# Patient Record
Sex: Male | Born: 1990 | Race: White | Hispanic: No | Marital: Single | Smoking: Never smoker
Health system: Southern US, Community
[De-identification: ages and names within clinical notes are randomized; demographics above are authoritative.]

---

## 2015-05-11 ENCOUNTER — Encounter (HOSPITAL_COMMUNITY): Payer: Self-pay | Admitting: Emergency Medicine

## 2015-05-11 ENCOUNTER — Emergency Department (HOSPITAL_COMMUNITY)

## 2015-05-11 ENCOUNTER — Emergency Department (HOSPITAL_COMMUNITY)
Admission: EM | Admit: 2015-05-11 | Discharge: 2015-05-11 | Disposition: A | Discharge status: Home / Self Care | Attending: Emergency Medicine | Admitting: Emergency Medicine

## 2015-05-11 DIAGNOSIS — Y92310 Basketball court as the place of occurrence of the external cause: Secondary | ICD-10-CM | POA: Insufficient documentation

## 2015-05-11 DIAGNOSIS — X58XXXA Exposure to other specified factors, initial encounter: Secondary | ICD-10-CM | POA: Diagnosis not present

## 2015-05-11 DIAGNOSIS — Y998 Other external cause status: Secondary | ICD-10-CM | POA: Diagnosis not present

## 2015-05-11 DIAGNOSIS — Y9367 Activity, basketball: Secondary | ICD-10-CM | POA: Diagnosis not present

## 2015-05-11 DIAGNOSIS — S92901A Unspecified fracture of right foot, initial encounter for closed fracture: Secondary | ICD-10-CM | POA: Diagnosis not present

## 2015-05-11 DIAGNOSIS — S99921A Unspecified injury of right foot, initial encounter: Secondary | ICD-10-CM | POA: Diagnosis present

## 2015-05-11 MED ORDER — IBUPROFEN 800 MG PO TABS: 800.0000 mg | ORAL_TABLET | Freq: Three times a day (TID) | ORAL | Status: AC

## 2015-05-11 MED ORDER — IBUPROFEN 800 MG PO TABS
800.0000 mg | ORAL_TABLET | Freq: Once | ORAL | Status: AC
  Administered 2015-05-11: 800 mg via ORAL
  Filled 2015-05-11: qty 1

## 2015-05-11 NOTE — ED Notes (Signed)
Pt reports was playing basketball and "rolled" right ankle. No deformity noted.

## 2015-05-11 NOTE — Discharge Instructions (Signed)
Metatarsal Fracture, Undisplaced  A metatarsal fracture is a break in the bone(s) of the foot. These are the bones of the foot that connect your toes to the bones of the ankle.  DIAGNOSIS   The diagnoses of these fractures are usually made with X-rays. If there are problems in the forefoot and x-rays are normal a later bone scan will usually make the diagnosis.   TREATMENT AND HOME CARE INSTRUCTIONS  · Treatment may or may not include a cast or walking shoe. When casts are needed the use is usually for short periods of time so as not to slow down healing with muscle wasting (atrophy).  · Activities should be stopped until further advised by your caregiver.  · Wear shoes with adequate shock absorbing capabilities and stiff soles.  · Alternative exercise may be undertaken while waiting for healing. These may include bicycling and swimming, or as your caregiver suggests.  · It is important to keep all follow-up visits or specialty referrals. The failure to keep these appointments could result in improper bone healing and chronic pain or disability.  · Warning: Do not drive a car or operate a motor vehicle until your caregiver specifically tells you it is safe to do so.  IF YOU DO NOT HAVE A CAST OR SPLINT:  · You may walk on your injured foot as tolerated or advised.  · Do not put any weight on your injured foot for as long as directed by your caregiver. Slowly increase the amount of time you walk on the foot as the pain allows or as advised.  · Use crutches until you can bear weight without pain. A gradual increase in weight bearing may help.  · Apply ice to the injury for 15-20 minutes each hour while awake for the first 2 days. Put the ice in a plastic bag and place a towel between the bag of ice and your skin.  · Only take over-the-counter or prescription medicines for pain, discomfort, or fever as directed by your caregiver.  SEEK IMMEDIATE MEDICAL CARE IF:   · Your cast gets damaged or breaks.  · You have  continued severe pain or more swelling than you did before the cast was put on, or the pain is not controlled with medications.  · Your skin or nails below the injury turn blue or grey, or feel cold or numb.  · There is a bad smell, or new stains or pus-like (purulent) drainage coming from the cast.  MAKE SURE YOU:   · Understand these instructions.  · Will watch your condition.  · Will get help right away if you are not doing well or get worse.  Document Released: 05/31/2002 Document Revised: 12/01/2011 Document Reviewed: 04/21/2008  [employer]® Patient Information ©2015 [employer]. This information is not intended to replace advice given to you by your health care provider. Make sure you discuss any questions you have with your health care provider.

## 2015-05-13 NOTE — ED Provider Notes (Addendum)
CSN: [id number]     Arrival date & time 05/11/15  1252 History   First MD Initiated Contact with Patient 05/11/15 1416     Chief Complaint  Patient presents with  . Foot Injury     (Consider location/radiation/quality/duration/timing/severity/associated sxs/prior Treatment) HPI   Dylan Mcintosh is a 24 y.o. male who is an imate at a local correctional facility, presents to the Emergency Department complaining of right foot and ankle pain.  He states that he was playing basketball shortly before ED arrival and "rolled" his ankle.  He c/o pain with attempted weight bearing.  Improves at rest.  He reports swelling to the outside of his foot.  He has not taken any medication for pain.  He denies open wound, weakness, knee pain, or other injuries.     History reviewed. No pertinent past medical history. History reviewed. No pertinent past surgical history. History reviewed. No pertinent family history. Social History  Substance Use Topics  . Smoking status: Never Smoker   . Smokeless tobacco: None  . Alcohol Use: No    Review of Systems  Constitutional: Negative for fever and chills.  Musculoskeletal: Positive for joint swelling and arthralgias.  Skin: Negative for color change and wound.  All other systems reviewed and are negative.     Allergies  Review of patient's allergies indicates not on file.  Home Medications   Prior to Admission medications   Medication Sig Start Date End Date Taking? Authorizing Provider  ibuprofen (ADVIL,MOTRIN) 800 MG tablet Take 1 tablet (800 mg total) by mouth 3 (three) times daily. 05/11/15   Kepler Mccabe, PA-C   BP 114/67 mmHg  Pulse 61  Temp(Src) 98.3 F (36.8 C) (Oral)  Resp 14  Ht 5\' 11"  (1.803 m)  Wt 175 lb (79.379 kg)  BMI 24.42 kg/m2  SpO2 100% Physical Exam  Constitutional: He is oriented to person, place, and time. He appears well-developed and well-nourished. No distress.  HENT:  Head: Normocephalic and atraumatic.   Cardiovascular: Normal rate, regular rhythm, normal heart sounds and intact distal pulses.   Pulmonary/Chest: Effort normal and breath sounds normal. No respiratory distress.  Musculoskeletal: He exhibits edema and tenderness.  Right lateral foot and ankle is ttp, mild STS is present.  ROM is preserved.  DP pulse is brisk,distal sensation intact.  No erythema, abrasion, bruising or bony deformity.  No proximal tenderness.  Neurological: He is alert and oriented to person, place, and time. He exhibits normal muscle tone. Coordination normal.  Skin: Skin is warm and dry.  Nursing note and vitals reviewed.   ED Course  Procedures (including critical care time) Labs Review Labs Reviewed - No data to display  Imaging Review  Dg Foot Complete Right  05/11/2015   CLINICAL DATA:  Injured foot today.  Lateral foot pain.  EXAM: RIGHT FOOT COMPLETE - 3+ VIEW  COMPARISON:  None.  FINDINGS: The joint spaces are maintained. There is a small bony density along the lateral aspect of the calcaneus which could reflect a small avulsion fracture. No other bony abnormalities are identified.  IMPRESSION: Possible small avulsion fracture off the lateral aspect of the calcaneus.   Electronically Signed   By: Rudie Meyer M.D.   On: 05/11/2015 13:50     I have personally reviewed and evaluated these images and lab results as part of my medical decision-making.   EKG Interpretation None      MDM   Final diagnoses:  Foot fracture, right, closed, initial encounter  Addendum:  Small avulsion fx of the right foot that appears to originate from the calcaneus per radiology report   XR finding discussed with patient.  Small avulsion fx.  Posterior splint applied.  Pain improved, remains NV intact.  Officer states that patient can f/u with medical and that crutches are available for patient's use.  rx for ibuprofen.  He agrees to elevate and ice.      Rosey Bath 05/13/15 2153  Glynn Octave,  MD 05/14/15 [id number]  Pauline Aus, PA-C 05/31/15 1655  Glynn Octave, MD 06/01/15 [phone]

## 2016-06-18 IMAGING — DX DG FOOT COMPLETE 3+V*R*
3 series · 3 of 3 positions shown · non-contrast
Comparison: None.

CLINICAL DATA: Injured foot today.  Lateral foot pain.

EXAM:
RIGHT FOOT COMPLETE - 3+ VIEW

[foot ap]
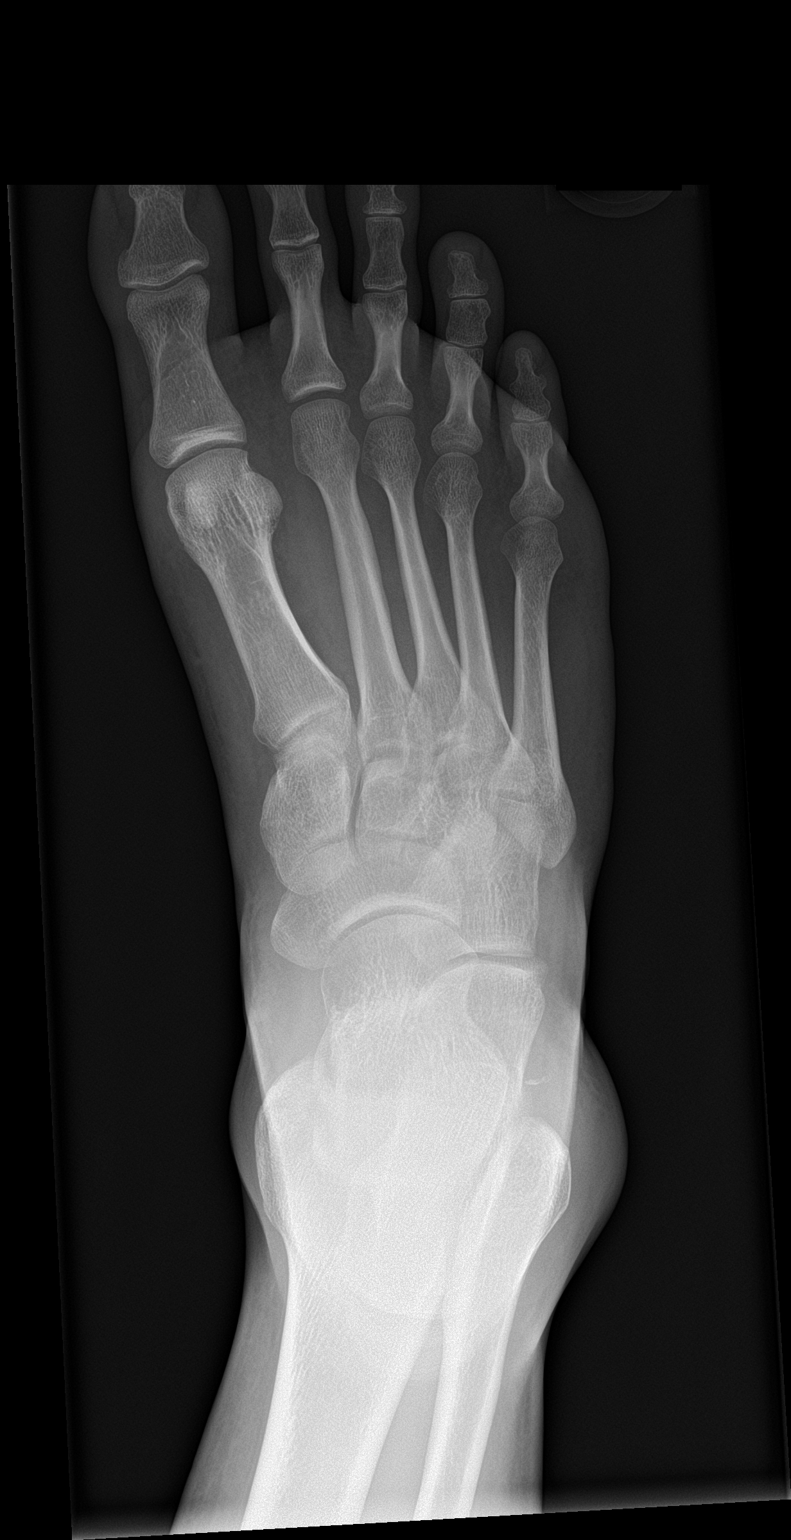

[foot obl]
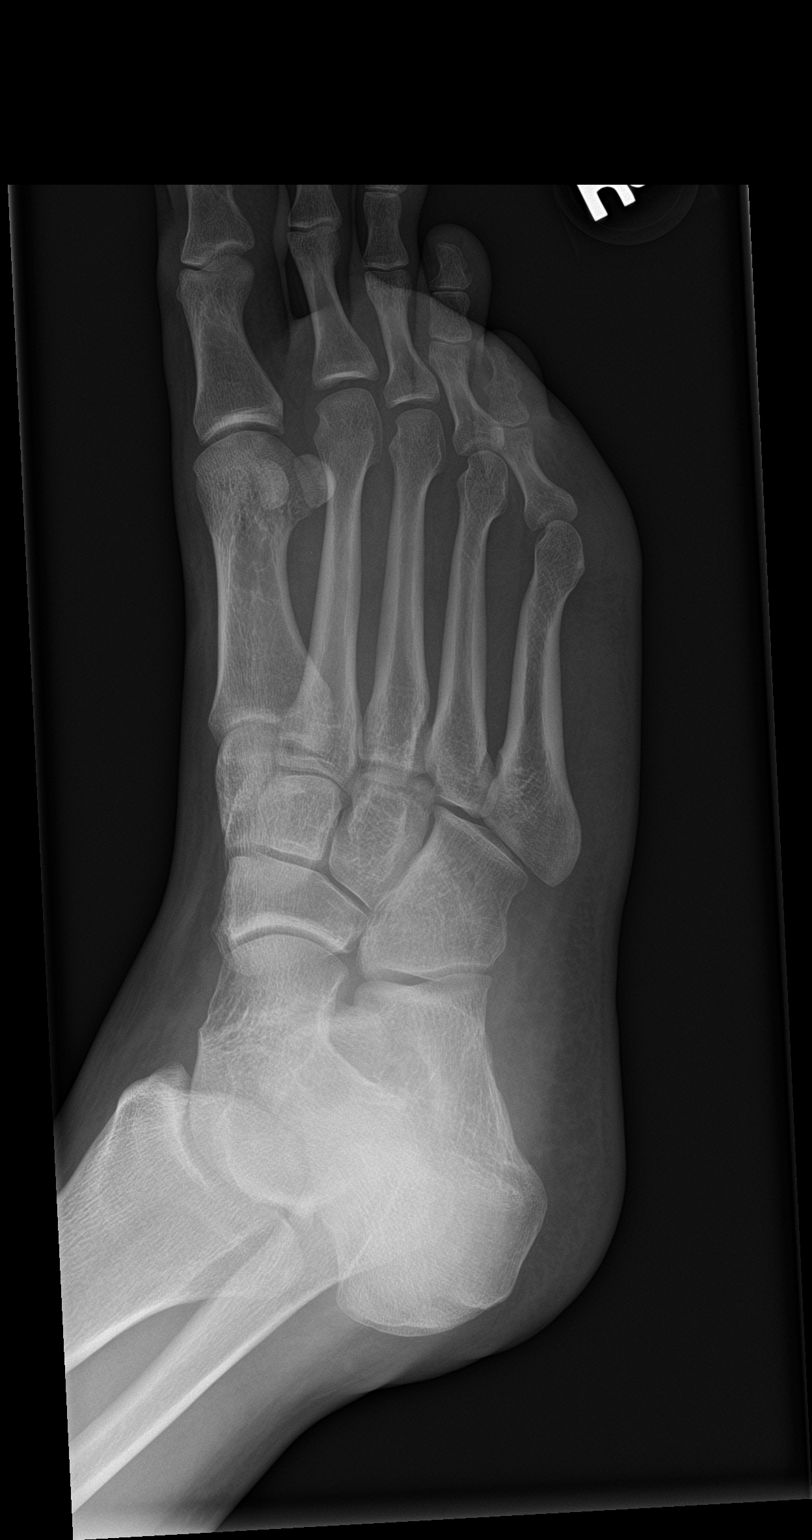

[foot lat]
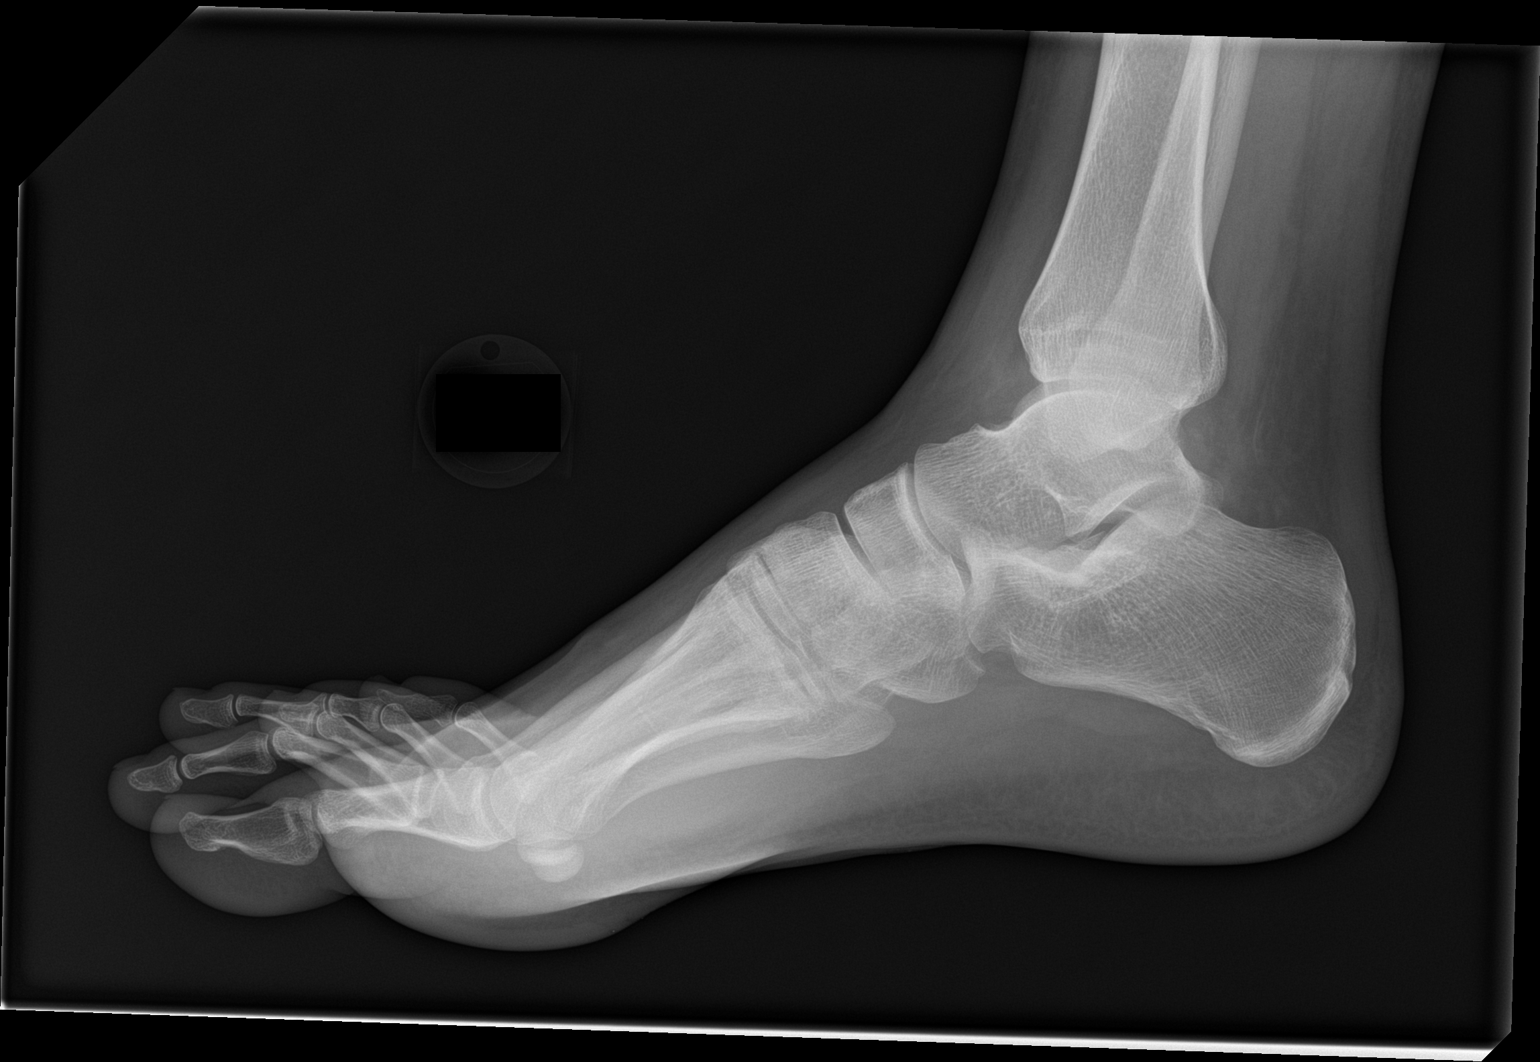

[3 of 3 positions shown; findings below may reference images not displayed]

FINDINGS: The joint spaces are maintained. There is a small bony density along
the lateral aspect of the calcaneus which could reflect a small
avulsion fracture. No other bony abnormalities are identified.
IMPRESSION: Possible small avulsion fracture off the lateral aspect of the
calcaneus.
